# Patient Record
Sex: Male | Born: 1965 | Race: White | Hispanic: No | Marital: Married | Smoking: Never smoker
Health system: Southern US, Community
[De-identification: ages and names within clinical notes are randomized; demographics above are authoritative.]

## PROBLEM LIST (undated history)

## (undated) DIAGNOSIS — I720 Aneurysm of carotid artery: Secondary | ICD-10-CM

## (undated) DIAGNOSIS — I10 Essential (primary) hypertension: Secondary | ICD-10-CM

## (undated) HISTORY — PX: HERNIA REPAIR: SHX51

## (undated) HISTORY — DX: Essential (primary) hypertension: I10

## (undated) HISTORY — DX: Aneurysm of carotid artery: I72.0

## (undated) HISTORY — PX: TONSILLECTOMY: SUR1361

---

## 2011-03-21 ENCOUNTER — Ambulatory Visit (HOSPITAL_BASED_OUTPATIENT_CLINIC_OR_DEPARTMENT_OTHER): Payer: BC Managed Care – PPO | Attending: Internal Medicine

## 2011-03-21 DIAGNOSIS — G4733 Obstructive sleep apnea (adult) (pediatric): Secondary | ICD-10-CM | POA: Insufficient documentation

## 2011-03-24 DIAGNOSIS — G4733 Obstructive sleep apnea (adult) (pediatric): Secondary | ICD-10-CM

## 2011-03-25 NOTE — Procedures (Signed)
Curtis Martinez, Curtis Martinez                ACCOUNT NO.:  [id number]  MEDICAL RECORD NO.:  [id number]          PATIENT TYPE:  OUT  LOCATION:  SLEEP CENTER                 FACILITY:  [facility]  PHYSICIAN:  Albirda Shiel D. Maple Hudson, MD, FCCP, FACPDATE OF BIRTH:  [birth year] 28, 1965  DATE OF STUDY:  03/21/2011                           NOCTURNAL POLYSOMNOGRAM  REFERRING PHYSICIAN:  Dr. Artis Delay  REFERRING PHYSICIAN:  Dr. Artis Delay.  INDICATION FOR STUDY:  Hypersomnia with sleep apnea.  EPWORTH SLEEPINESS SCORE:  3/24.  BMI 31, weight 253 pounds, height 73 inches, neck 16.5 inches.  MEDICATIONS:  Home medications are charted and reviewed.  SLEEP ARCHITECTURE:  Split study protocol.  During the diagnostic phase, total sleep time 172 minutes with sleep efficiency 88%.  Stage I was 3.5%, stage II 88.1%, stage III absent, REM 8.4% of total sleep time. Sleep latency 5.5 minutes, REM latency 157 minutes, awake after sleep onset 18 minutes, arousal index 20.9.  BEDTIME MEDICATION:  None.  RESPIRATORY DATA:  Split study protocol.  Apnea-hypopnea index (AHI) 15.7 per hour.  A total of 45 events was scored including 14 obstructive apneas and 31 hypopneas.  Events were associated with supine sleep position.  REM AHI 62.1 per hour.  CPAP was titrated to 13 CWP, AHI 0 per hour.  He wore a medium ResMed Quattro FX full-face mask with heated humidifier.  OXYGEN DATA:  Before CPAP, snoring was loud with oxygen desaturation to a nadir of 74% on room air.  With CPAP titration, mean oxygen saturation held 95.3% on room air and snoring was prevented.  CARDIAC DATA:  Normal sinus rhythm.  MOVEMENT-PARASOMNIA:  No significant movement disturbance.  Bathroom x2.  IMPRESSIONS-RECOMMENDATIONS: 1. Mild-to-moderate obstructive sleep apnea/hypopnea syndrome, AHI     15.7 per hour with supine events, loud snoring and oxygen     desaturation to a nadir of 74% on room air. 2. Successful continuous positive airway pressure  titration to 13 CWP,     AHI 0 per hour.  He wore a medium     ResMed Quattro FX full-face mask with heated humidifier.  Snoring     was prevented and oxygenation improved.     Sharniece Gibbon D. Maple Hudson, MD, [facility], FACP Diplomate, Biomedical engineer of Sleep Medicine Electronically Signed    CDY/MEDQ  D:  03/24/2011 10:44:59  T:  03/25/2011 00:37:52  Job:  [id number]

## 2014-07-21 ENCOUNTER — Other Ambulatory Visit: Payer: Self-pay | Admitting: Family Medicine

## 2014-07-21 DIAGNOSIS — R9389 Abnormal findings on diagnostic imaging of other specified body structures: Secondary | ICD-10-CM

## 2014-07-30 ENCOUNTER — Ambulatory Visit
Admission: RE | Admit: 2014-07-30 | Discharge: 2014-07-30 | Disposition: A | Discharge status: Home / Self Care | Payer: BLUE CROSS/BLUE SHIELD | Source: Ambulatory Visit | Attending: Family Medicine | Admitting: Family Medicine

## 2014-07-30 DIAGNOSIS — R9389 Abnormal findings on diagnostic imaging of other specified body structures: Secondary | ICD-10-CM

## 2015-09-01 IMAGING — US US CAROTID DUPLEX BILAT
1 series · 13 of 24 positions shown · non-contrast
Comparison: None.

CLINICAL DATA: Abnormal carotid ultrasound on [facility] screening
examination approximately 6 months ago. History of hypertension.

EXAM:
BILATERAL CAROTID DUPLEX ULTRASOUND
TECHNIQUE: Gray scale imaging, color Doppler and duplex ultrasound were
performed of bilateral carotid and vertebral arteries in the neck.

[Series 1: us carotid duplex bilat · 0.08mm/px · 13 of 65 slices shown]
[im 1/65]
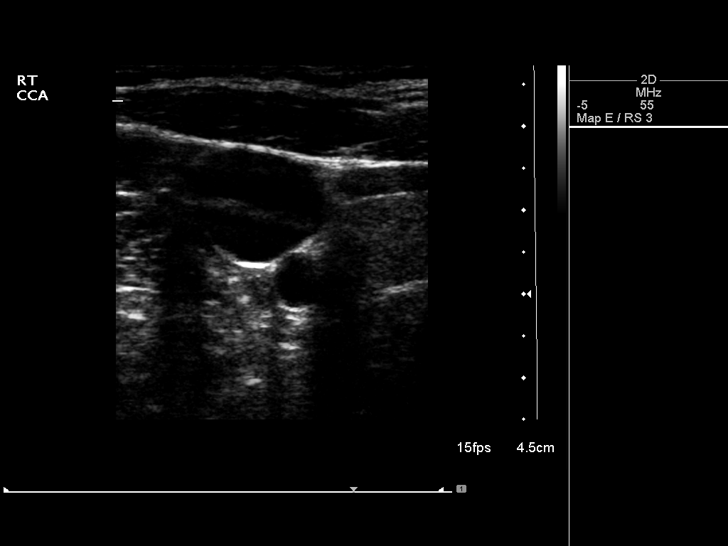
[im 6/65]
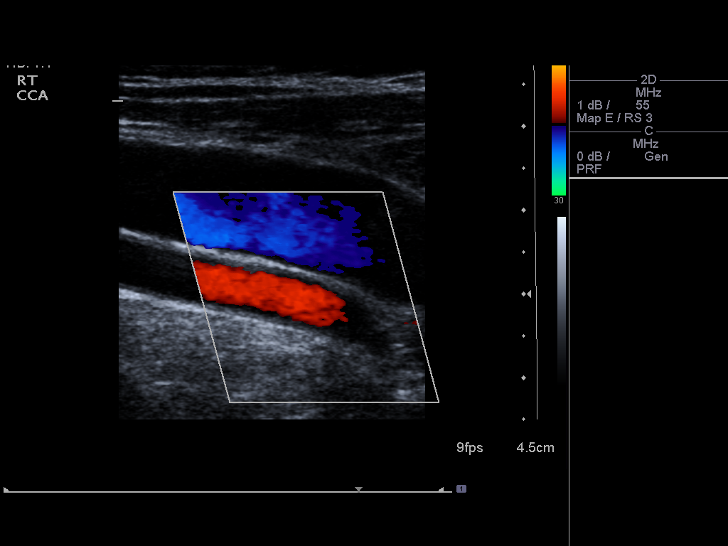
[im 12/65]
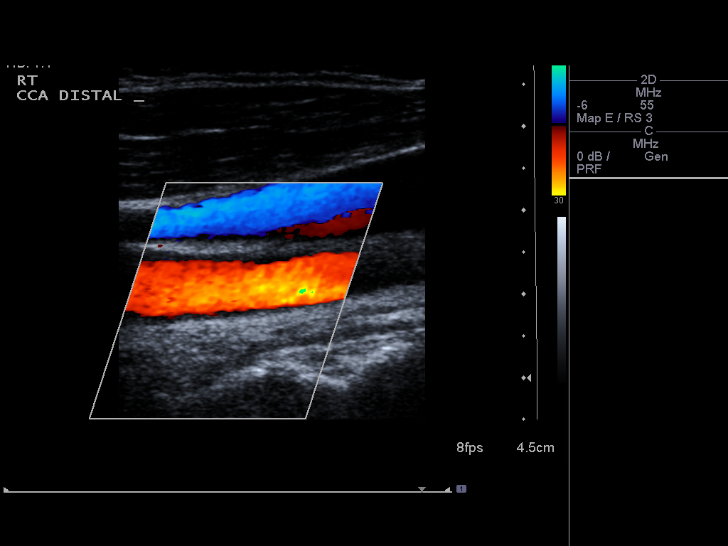
[im 17/65]
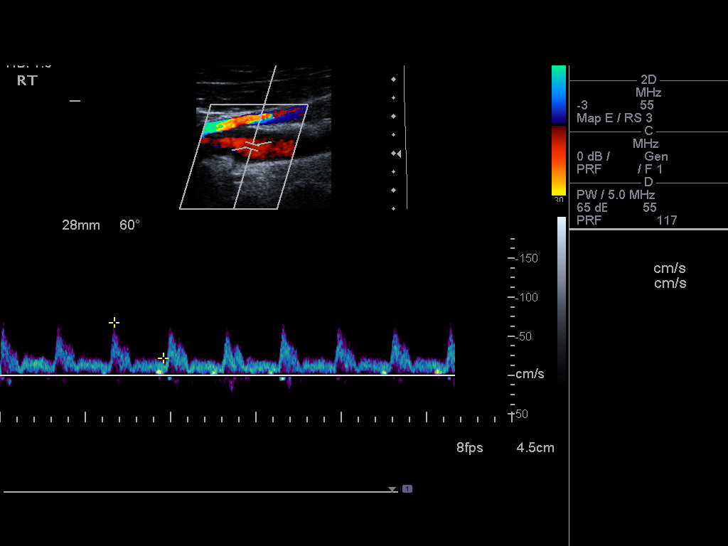
[im 23/65]
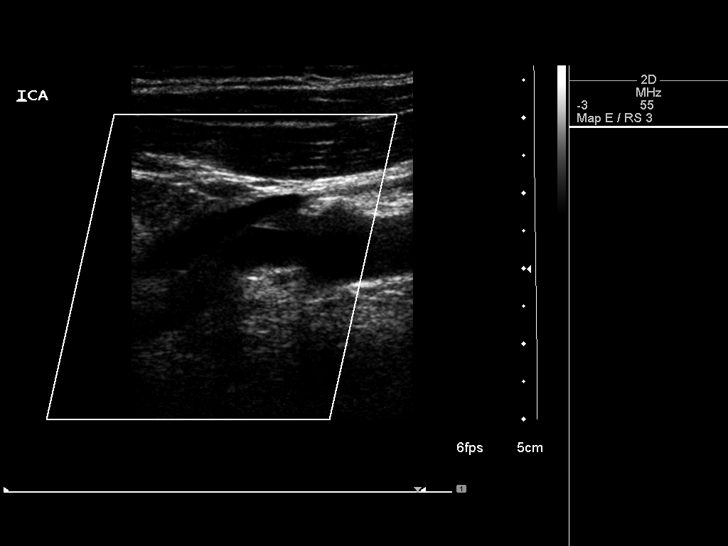
[im 28/65]
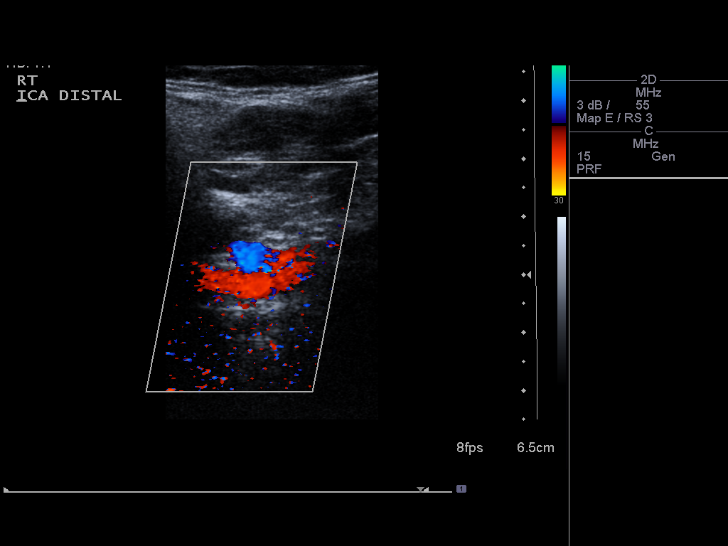
[im 34/65]
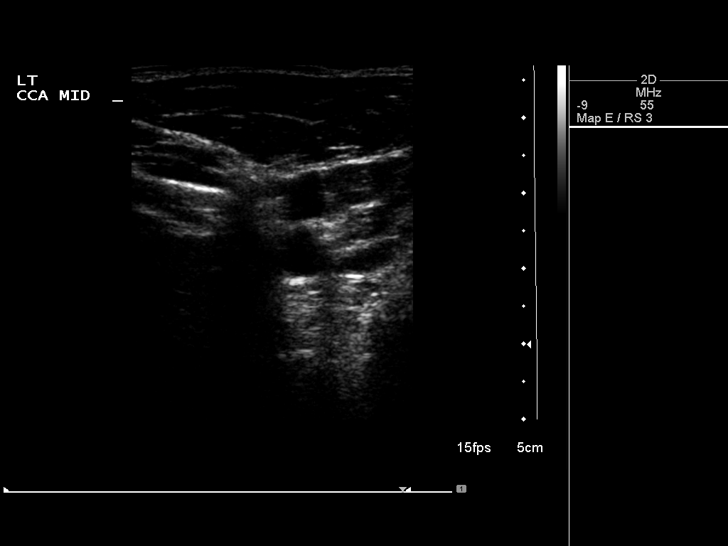
[im 37/65]
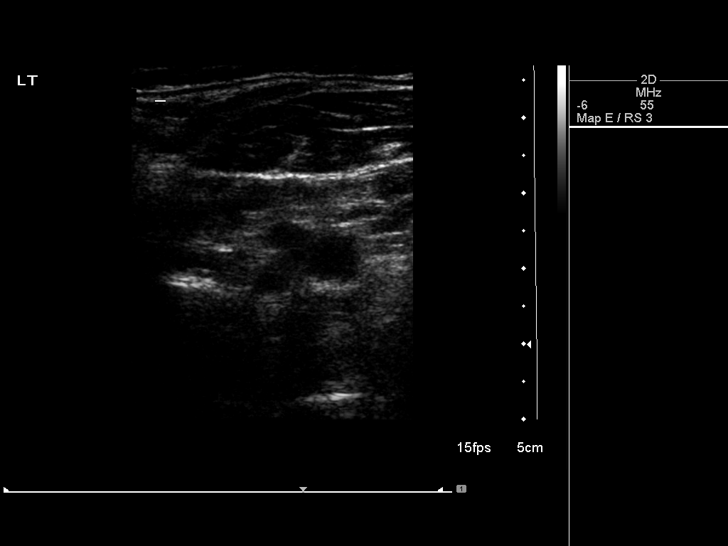
[im 42/65]
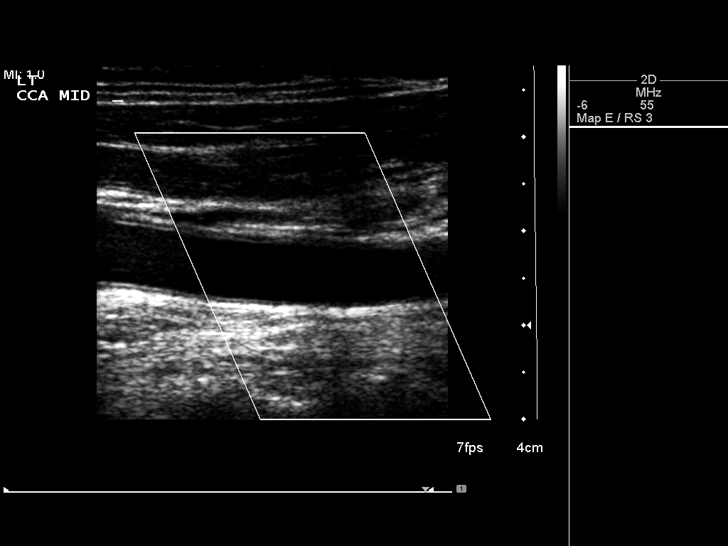
[im 48/65]
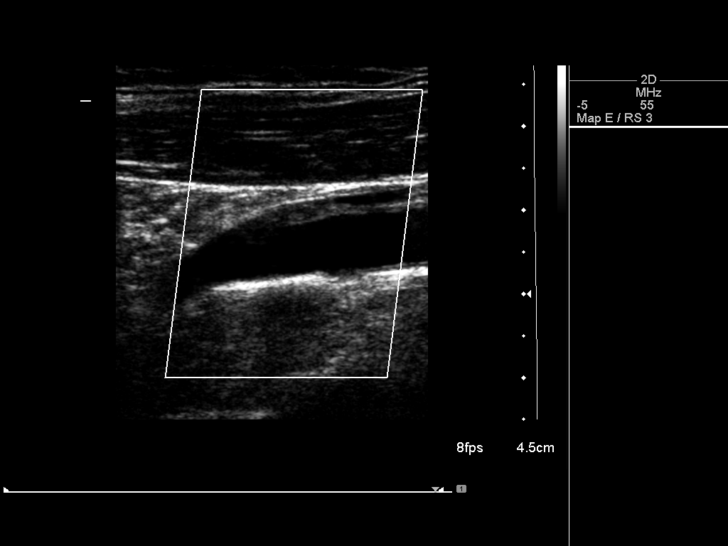
[im 53/65]
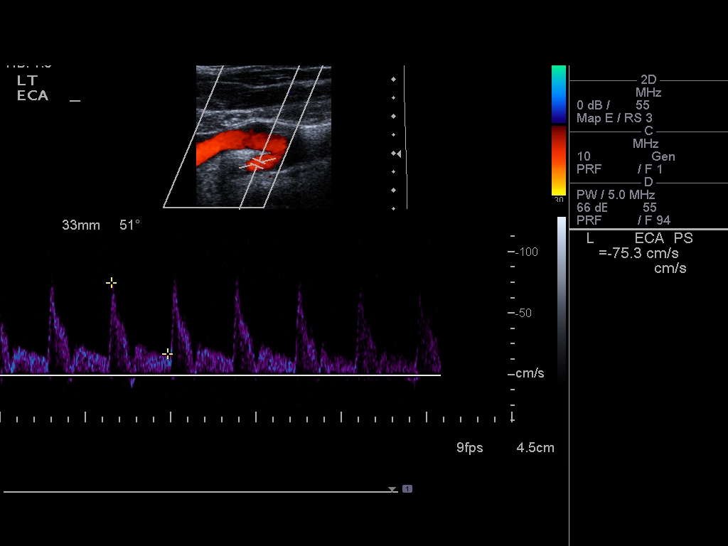
[im 59/65]
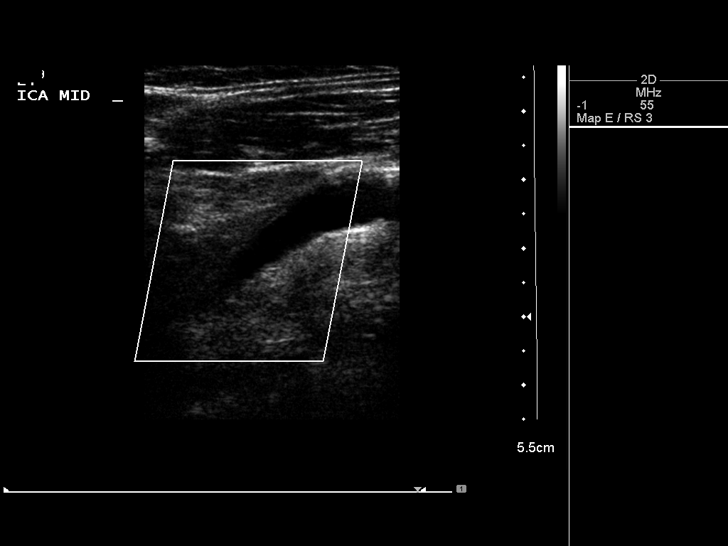
[im 65/65]
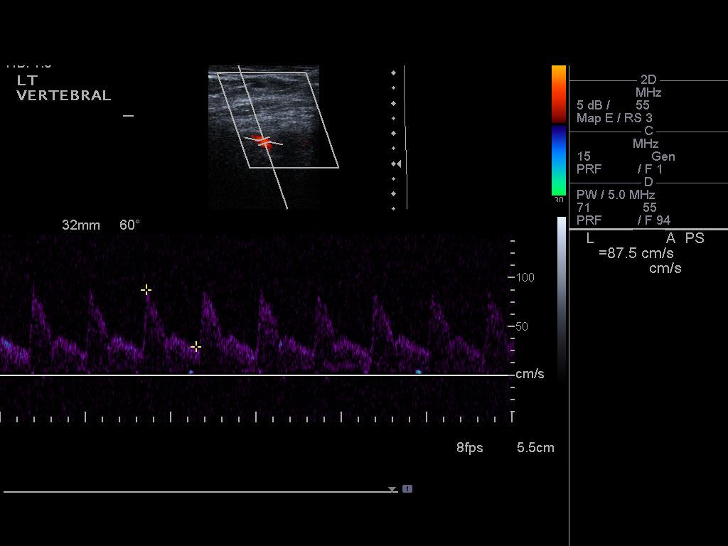

[13 of 24 positions shown; findings below may reference images not displayed]

FINDINGS: Criteria: Quantification of carotid stenosis is based on velocity
parameters that correlate the residual internal carotid diameter
with NASCET-based stenosis levels, using the diameter of the distal
internal carotid lumen as the denominator for stenosis measurement.

The following velocity measurements were obtained:

RIGHT

ICA:  67/17 cm/sec

CCA:  125/21 cm/sec

SYSTOLIC ICA/CCA RATIO:

DIASTOLIC ICA/CCA RATIO:

ECA:  99 cm/sec

LEFT

ICA:  71/30 cm/sec

CCA:  132/27 cm/sec

SYSTOLIC ICA/CCA RATIO:

DIASTOLIC ICA/CCA RATIO:

ECA:  90 cm/sec

RIGHT CAROTID ARTERY: There is a minimal amount of hypoechoic plaque
involving the origin and proximal aspects of the right internal
carotid artery (image 24), not resulting in elevated peak systolic
velocities within the interrogated course of the right internal
carotid artery to suggest a hemodynamically significant stenosis.

RIGHT VERTEBRAL ARTERY:  Anechoic the

LEFT CAROTID ARTERY: There is a minimal amount of hypoechoic plaque
within the left carotid bulb (image 50), extending to involve the
origin and proximal aspects of the left internal carotid artery
(image 57), not resulting in elevated peak systolic velocities
within the interrogated course of the left internal carotid artery
to suggest a hemodynamically significant stenosis.

LEFT VERTEBRAL ARTERY:  Antegrade flow
IMPRESSION: Minimal amount of bilateral atherosclerotic plaque, left
subjectively greater than right, not resulting in a hemodynamically
significant stenosis.

## 2017-11-22 DIAGNOSIS — Z23 Encounter for immunization: Secondary | ICD-10-CM | POA: Diagnosis not present

## 2017-11-22 DIAGNOSIS — E782 Mixed hyperlipidemia: Secondary | ICD-10-CM | POA: Diagnosis not present

## 2017-11-22 DIAGNOSIS — I10 Essential (primary) hypertension: Secondary | ICD-10-CM | POA: Diagnosis not present

## 2017-11-22 DIAGNOSIS — M2550 Pain in unspecified joint: Secondary | ICD-10-CM | POA: Diagnosis not present

## 2017-11-22 DIAGNOSIS — R7301 Impaired fasting glucose: Secondary | ICD-10-CM | POA: Diagnosis not present

## 2017-12-14 DIAGNOSIS — Z719 Counseling, unspecified: Secondary | ICD-10-CM | POA: Diagnosis not present

## 2018-01-01 DIAGNOSIS — Z719 Counseling, unspecified: Secondary | ICD-10-CM | POA: Diagnosis not present

## 2018-01-08 DIAGNOSIS — Z719 Counseling, unspecified: Secondary | ICD-10-CM | POA: Diagnosis not present

## 2018-01-15 DIAGNOSIS — Z719 Counseling, unspecified: Secondary | ICD-10-CM | POA: Diagnosis not present

## 2018-01-22 DIAGNOSIS — Z719 Counseling, unspecified: Secondary | ICD-10-CM | POA: Diagnosis not present

## 2021-02-24 ENCOUNTER — Other Ambulatory Visit: Payer: Self-pay | Admitting: Family Medicine

## 2021-02-24 DIAGNOSIS — I779 Disorder of arteries and arterioles, unspecified: Secondary | ICD-10-CM

## 2021-03-09 ENCOUNTER — Other Ambulatory Visit: Payer: BLUE CROSS/BLUE SHIELD

## 2021-03-15 ENCOUNTER — Ambulatory Visit
Admission: RE | Admit: 2021-03-15 | Discharge: 2021-03-15 | Disposition: A | Discharge status: Home / Self Care | Payer: BLUE CROSS/BLUE SHIELD | Source: Ambulatory Visit | Attending: Family Medicine | Admitting: Family Medicine

## 2021-03-15 DIAGNOSIS — I779 Disorder of arteries and arterioles, unspecified: Secondary | ICD-10-CM

## 2021-04-25 ENCOUNTER — Other Ambulatory Visit: Payer: Self-pay

## 2021-04-25 ENCOUNTER — Encounter: Payer: Self-pay | Admitting: Vascular Surgery

## 2021-04-25 ENCOUNTER — Ambulatory Visit (INDEPENDENT_AMBULATORY_CARE_PROVIDER_SITE_OTHER): Payer: Managed Care, Other (non HMO) | Admitting: Vascular Surgery

## 2021-04-25 DIAGNOSIS — I6521 Occlusion and stenosis of right carotid artery: Secondary | ICD-10-CM

## 2021-04-25 DIAGNOSIS — I779 Disorder of arteries and arterioles, unspecified: Secondary | ICD-10-CM | POA: Insufficient documentation

## 2021-04-25 NOTE — Progress Notes (Signed)
Patient name: Curtis Martinez MRN: [id number] DOB: 1965/12/17 Sex: male  REASON FOR CONSULT: Evaluate carotid artery stenosis  HPI: Curtis Martinez is a 55 y.o. male, with history of hypertension, prediabetes, obstructive sleep apnea the presents for evaluation of carotid artery stenosis.  Patient had a screening study many years ago that identified evidence of carotid artery disease.  This has been followed by his PCP.  He recently had a repeat study on 03/15/2021 that showed a moderate amount of right-sided atherosclerotic plaque with elevated peak systolic velocities compatible with a 50 to 69% stenosis.  He denies any history of strokes or TIAs.  He has no history of neck surgery or neck radiation.  He does not smoke.  He does take statin daily.  He is not on aspirin.  Past medical history below states a right carotid aneurysm but he has no knowledge of this.  There are no documentation of aneurysms in the report.  Past Medical History:  Diagnosis Date   Carotid aneurysm, right (HCC)    Hypertension     Past Surgical History:  Procedure Laterality Date   HERNIA REPAIR     TONSILLECTOMY      Family History  Problem Relation Age of Onset   Alzheimer's disease Mother    Cancer Father     SOCIAL HISTORY: Social History   Socioeconomic History   Marital status: Married    Spouse name: Not on file   Number of children: Not on file   Years of education: Not on file   Highest education level: Not on file  Occupational History   Not on file  Tobacco Use   Smoking status: Never   Smokeless tobacco: Never  Substance and Sexual Activity   Alcohol use: Yes   Drug use: Never   Sexual activity: Not on file  Other Topics Concern   Not on file  Social History Narrative   Not on file   Social Determinants of Health   Financial Resource Strain: Not on file  Food Insecurity: Not on file  Transportation Needs: Not on file  Physical Activity: Not on file  Stress: Not on file   Social Connections: Not on file  Intimate Partner Violence: Not on file    No Known Allergies  No current outpatient medications on file.   No current facility-administered medications for this visit.    REVIEW OF SYSTEMS:  [X]  denotes positive finding, [ ]  denotes negative finding Cardiac  Comments:  Chest pain or chest pressure:    Shortness of breath upon exertion:    Short of breath when lying flat:    Irregular heart rhythm:        Vascular    Pain in calf, thigh, or hip brought on by ambulation:    Pain in feet at night that wakes you up from your sleep:     Blood clot in your veins:    Leg swelling:         Pulmonary    Oxygen at home:    Productive cough:     Wheezing:         Neurologic    Sudden weakness in arms or legs:     Sudden numbness in arms or legs:     Sudden onset of difficulty speaking or slurred speech:    Temporary loss of vision in one eye:     Problems with dizziness:         Gastrointestinal  Blood in stool:     Vomited blood:         Genitourinary    Burning when urinating:     Blood in urine:        Psychiatric    Major depression:         Hematologic    Bleeding problems:    Problems with blood clotting too easily:        Skin    Rashes or ulcers:        Constitutional    Fever or chills:      PHYSICAL EXAM: Vitals:   04/25/21 0820 04/25/21 0827  BP: (!) 143/79 132/78  Pulse: 93 93  Resp: 18   Temp: 97.8 F (36.6 C)   TempSrc: Temporal   SpO2: 97%   Weight: 265 lb 3.2 oz (120.3 kg)   Height: 6\' 1"  (1.854 m)     GENERAL: The patient is a well-nourished male, in no acute distress. The vital signs are documented above. CARDIAC: There is a regular rate and rhythm.  VASCULAR:  Palpable carotid pulses bilaterally No previous neck incision PULMONARY: No respiratory distress. ABDOMEN: Soft and non-tender . MUSCULOSKELETAL: There are no major deformities or cyanosis. NEUROLOGIC: No focal weakness or paresthesias  are detected.  Cranial nerves II through XII grossly intact SKIN: There are no ulcers or rashes noted. PSYCHIATRIC: The patient has a normal affect.  DATA:   Carotid ultrasound from [facility] 03/15/2021 shows moderate amount of right-sided atherosclerotic plaque progressed from 2016 with right internal carotid artery velocities compatible with 50 to 69% luminal narrowing.  Assessment/Plan:  55 year old male presents for evaluation of asymptomatic right 50 to 69% ICA stenosis.  I discussed that in the setting of asymptomatic carotid disease there is no indication for surgical intervention at this time.  We reserve surgical intervention for greater than 80% stenosis for asymptomatic carotid disease.  I have recommended medical management at this time and he is already taking statin and I have asked that he also start an 81 mg aspirin daily for cardiovascular risk reduction.  I will have him follow-up in 6 months with carotid ultrasound here in the office and then to see me.  Discussed he call with questions or concerns.   55, MD [facility] [place] Office: [phone]

## 2021-04-26 ENCOUNTER — Other Ambulatory Visit: Payer: Self-pay | Admitting: *Deleted

## 2021-04-26 DIAGNOSIS — I6521 Occlusion and stenosis of right carotid artery: Secondary | ICD-10-CM

## 2021-11-07 ENCOUNTER — Ambulatory Visit (INDEPENDENT_AMBULATORY_CARE_PROVIDER_SITE_OTHER): Payer: Managed Care, Other (non HMO) | Admitting: Vascular Surgery

## 2021-11-07 ENCOUNTER — Encounter: Payer: Self-pay | Admitting: Vascular Surgery

## 2021-11-07 ENCOUNTER — Ambulatory Visit (HOSPITAL_COMMUNITY)
Admission: RE | Admit: 2021-11-07 | Discharge: 2021-11-07 | Disposition: A | Discharge status: Home / Self Care | Payer: Managed Care, Other (non HMO) | Source: Ambulatory Visit | Attending: Vascular Surgery | Admitting: Vascular Surgery

## 2021-11-07 VITALS — BP 129/80 | HR 85 | Temp 97.6°F | Resp 18 | Ht 73.0 in | Wt 272.0 lb

## 2021-11-07 DIAGNOSIS — I6521 Occlusion and stenosis of right carotid artery: Secondary | ICD-10-CM

## 2021-11-07 DIAGNOSIS — I6523 Occlusion and stenosis of bilateral carotid arteries: Secondary | ICD-10-CM

## 2021-11-07 NOTE — Progress Notes (Signed)
Patient name: Curtis Martinez MRN: [id number] DOB: 1965-09-23 Sex: male  REASON FOR CONSULT: 6 month follow-up for carotid artery surveillance  HPI: Curtis Martinez is a 56 y.o. male, with history of hypertension, prediabetes, obstructive sleep apnea the presents for 6 month follow-up of his carotid artery disease.  Patient had a screening study many years ago that identified evidence of carotid artery disease.  This has been followed by his PCP.  He had a repeat study on 03/15/2021 that showed a moderate amount of right-sided atherosclerotic plaque with elevated peak systolic velocities compatible with a 50 to 69% stenosis.  We recommended interval 70-month follow-up and he presents today.  He denies any history of stroke or TIA.  He had no vision loss or weakness in the last 6 months.  Has gained some weight over the winter.    Past Medical History:  Diagnosis Date   Carotid aneurysm, right (HCC)    Hypertension     Past Surgical History:  Procedure Laterality Date   HERNIA REPAIR     TONSILLECTOMY      Family History  Problem Relation Age of Onset   Alzheimer's disease Mother    Cancer Father     SOCIAL HISTORY: Social History   Socioeconomic History   Marital status: Married    Spouse name: Not on file   Number of children: Not on file   Years of education: Not on file   Highest education level: Not on file  Occupational History   Not on file  Tobacco Use   Smoking status: Never   Smokeless tobacco: Never  Substance and Sexual Activity   Alcohol use: Yes   Drug use: Never   Sexual activity: Not on file  Other Topics Concern   Not on file  Social History Narrative   Not on file   Social Determinants of Health   Financial Resource Strain: Not on file  Food Insecurity: Not on file  Transportation Needs: Not on file  Physical Activity: Not on file  Stress: Not on file  Social Connections: Not on file  Intimate Partner Violence: Not on file    No Known  Allergies  Current Outpatient Medications  Medication Sig Dispense Refill   aspirin EC 81 MG tablet Take 81 mg by mouth daily. Swallow whole.     betamethasone dipropionate 0.05 % cream 1 application a thin film to affected area     hydrochlorothiazide (MICROZIDE) 12.5 MG capsule 1 capsule in the morning     lisinopril (ZESTRIL) 40 MG tablet 1 tablet     rosuvastatin (CRESTOR) 5 MG tablet 1 tablet     sildenafil (REVATIO) 20 MG tablet      No current facility-administered medications for this visit.    REVIEW OF SYSTEMS:  [X]  denotes positive finding, [ ]  denotes negative finding Cardiac  Comments:  Chest pain or chest pressure:    Shortness of breath upon exertion:    Short of breath when lying flat:    Irregular heart rhythm:        Vascular    Pain in calf, thigh, or hip brought on by ambulation:    Pain in feet at night that wakes you up from your sleep:     Blood clot in your veins:    Leg swelling:         Pulmonary    Oxygen at home:    Productive cough:     Wheezing:  Neurologic    Sudden weakness in arms or legs:     Sudden numbness in arms or legs:     Sudden onset of difficulty speaking or slurred speech:    Temporary loss of vision in one eye:     Problems with dizziness:         Gastrointestinal    Blood in stool:     Vomited blood:         Genitourinary    Burning when urinating:     Blood in urine:        Psychiatric    Major depression:         Hematologic    Bleeding problems:    Problems with blood clotting too easily:        Skin    Rashes or ulcers:        Constitutional    Fever or chills:      PHYSICAL EXAM: Vitals:   11/07/21 1440 11/07/21 1442  BP: 139/81 129/80  Pulse: 85 85  Resp: 18   Temp: 97.6 F (36.4 C)   TempSrc: Temporal   SpO2: 97%   Weight: 272 lb (123.4 kg)   Height: 6\' 1"  (1.854 m)     GENERAL: The patient is a well-nourished male, in no acute distress. The vital signs are documented  above. CARDIAC: There is a regular rate and rhythm.  VASCULAR:  No previous neck incision PULMONARY: No respiratory distress. ABDOMEN: Soft and non-tender . MUSCULOSKELETAL: There are no major deformities or cyanosis. NEUROLOGIC: No focal weakness or paresthesias are detected.  Cranial nerves II through XII grossly intact SKIN: There are no ulcers or rashes noted. PSYCHIATRIC: The patient has a normal affect.  DATA:   Carotid duplex today shows 40 to 59% right ICA stenosis by velocity criteria with a velocity 161/46 and minimal 1-39% stenosis on the left  Carotid ultrasound from [facility] imaging 03/15/2021 shows moderate amount of right-sided atherosclerotic plaque with right internal carotid artery velocities compatible with 50 to 69% luminal narrowing.  Assessment/Plan:  56 year old male presents for interval 15-month follow-up of a moderate 50 to 69% right ICA stenosis.  He carotid disease remains asymptomatic.  Based on carotid duplex today he has decreased velocities on the right suggesting a 40 to 59% stenosis.  No indication for surgical intervention.  Discussed we reserve surgical intervention for greater than 80% stenosis for asymptomatic carotid disease.  I have recommended continued medical management with aspirin and statin.  I will have him follow-up in 1 year with carotid ultrasound here in the office.   8-month, MD [facility] [place] Office: [phone]

## 2022-04-17 IMAGING — US US CAROTID DUPLEX BILAT
1 series · 13 of 24 positions shown · non-contrast
Comparison: 07/30/2014

CLINICAL DATA: Carotid artery disease. History of hypertension and
hyperlipidemia.

EXAM:
BILATERAL CAROTID DUPLEX ULTRASOUND
TECHNIQUE: Gray scale imaging, color Doppler and duplex ultrasound were
performed of bilateral carotid and vertebral arteries in the neck.

[Series 1: us carotid duplex bilat · 0.06mm/px · 13 of 75 slices shown]
[im 1/75]
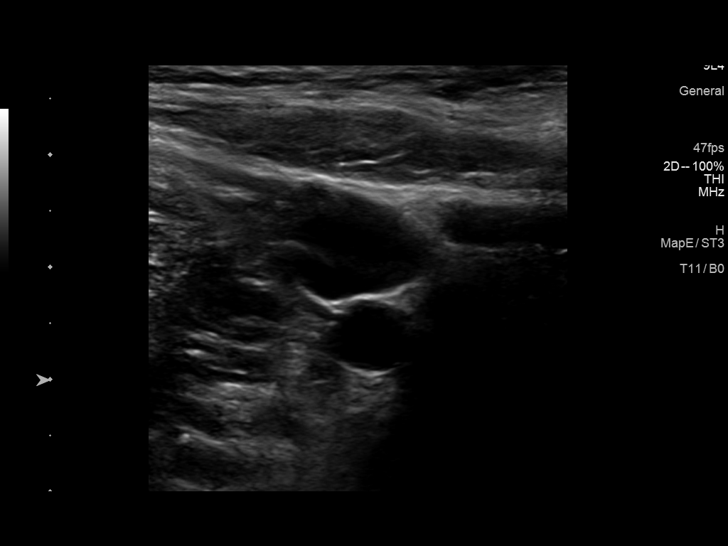
[im 7/75]
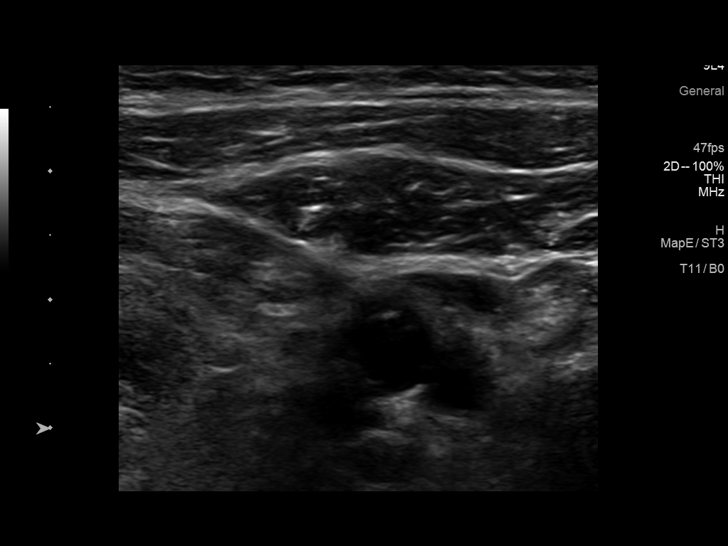
[im 13/75]
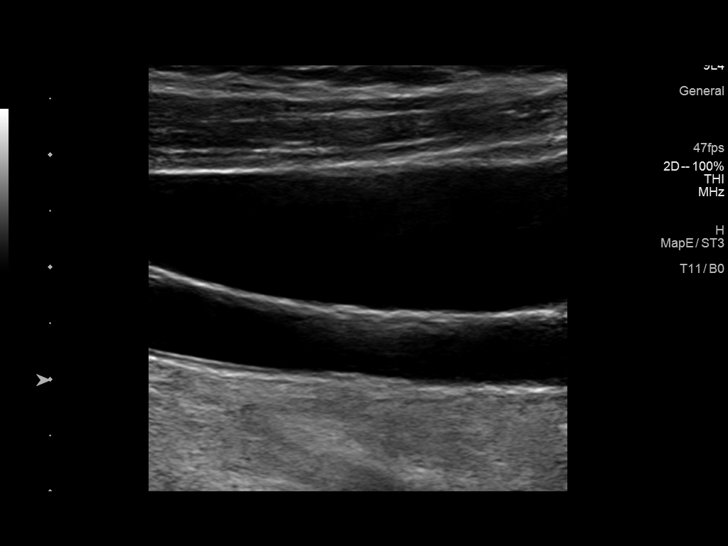
[im 20/75]
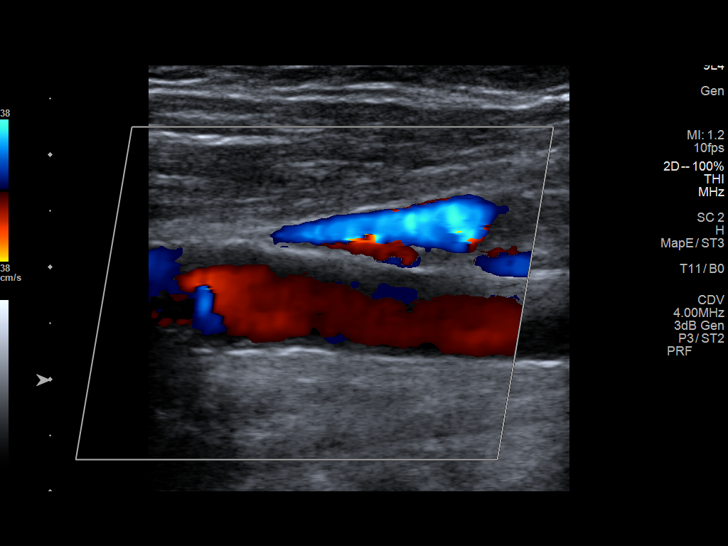
[im 26/75]
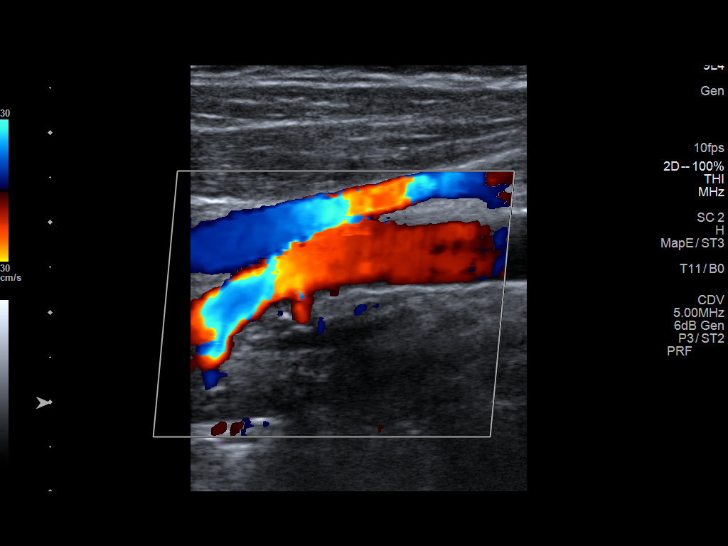
[im 33/75]
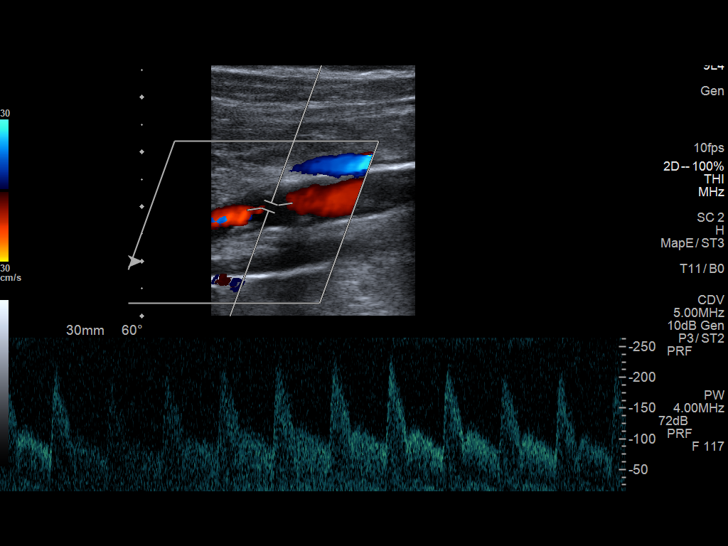
[im 39/75]
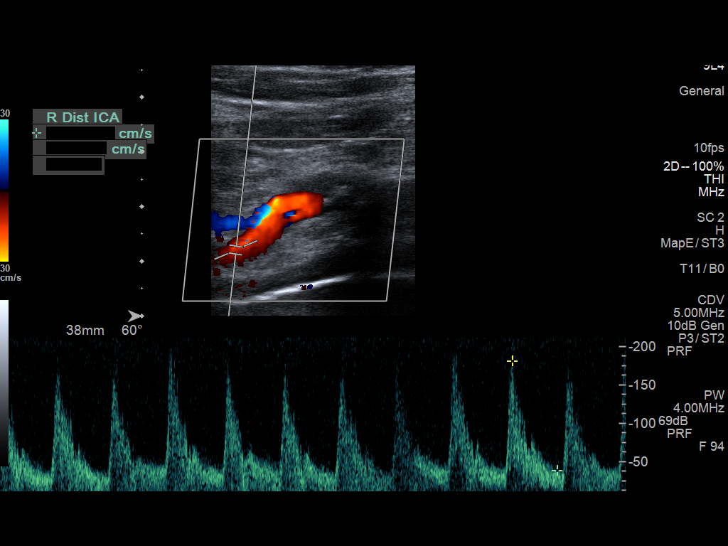
[im 42/75]
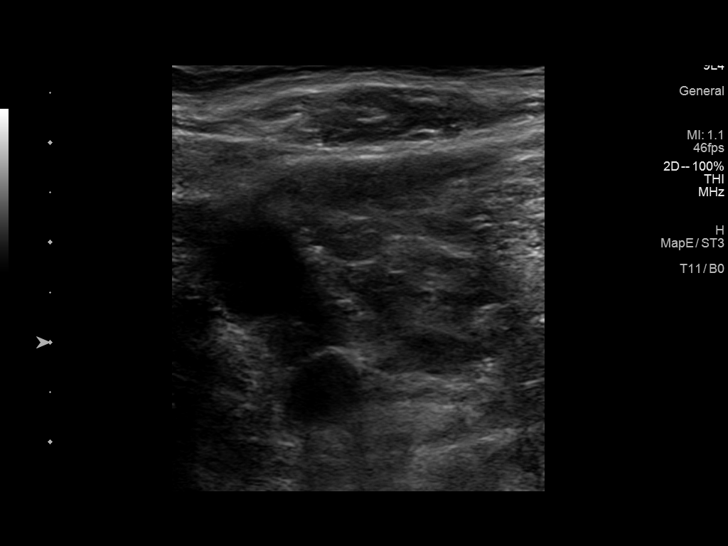
[im 49/75]
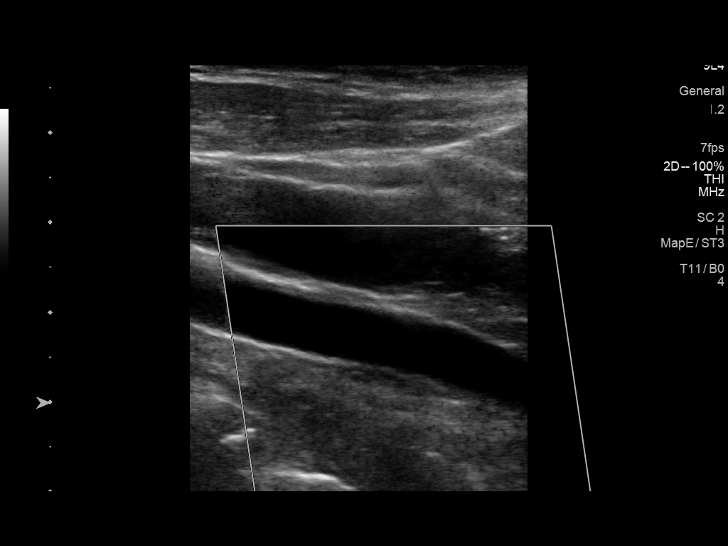
[im 55/75]
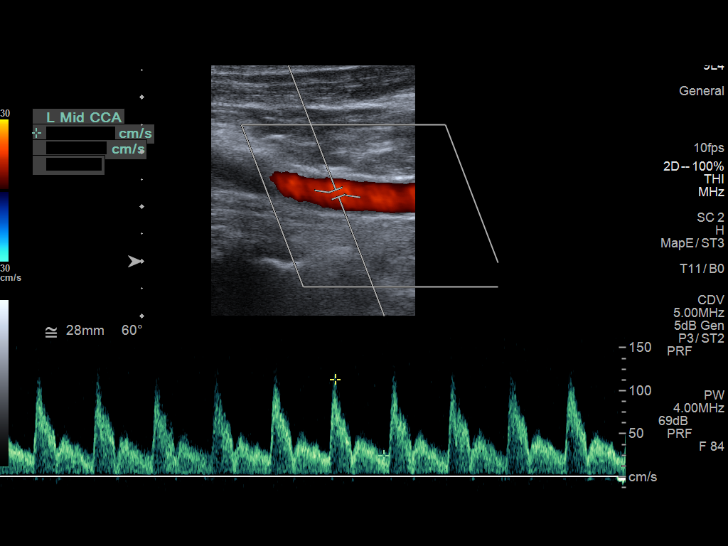
[im 62/75]
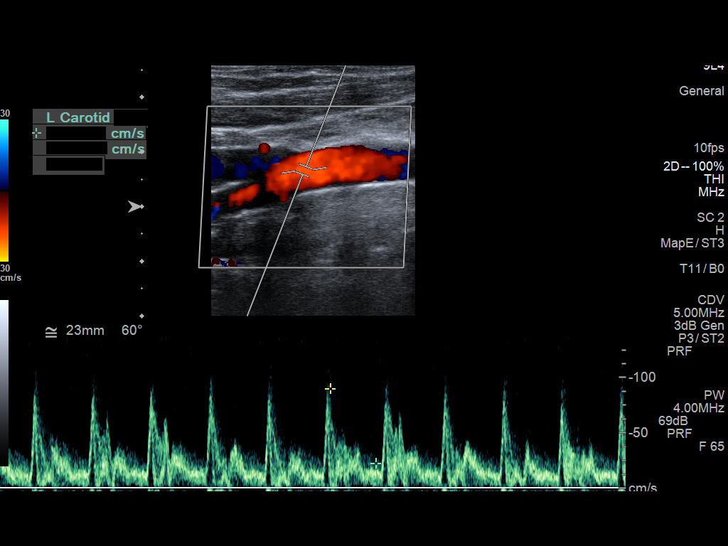
[im 68/75]
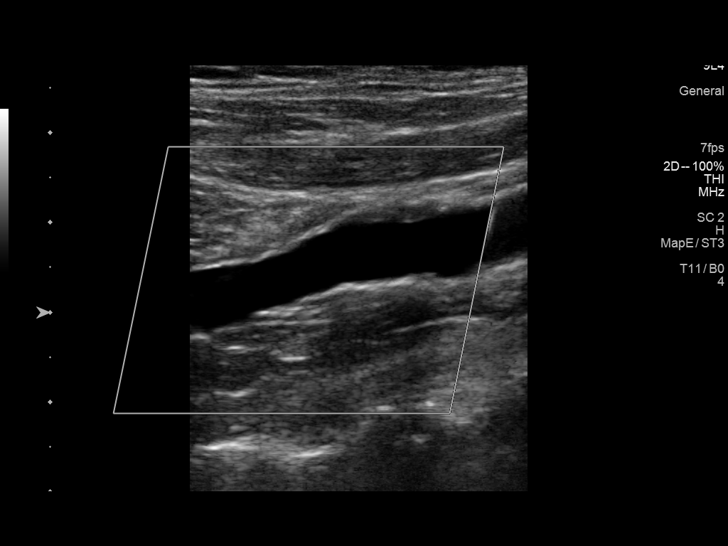
[im 75/75]
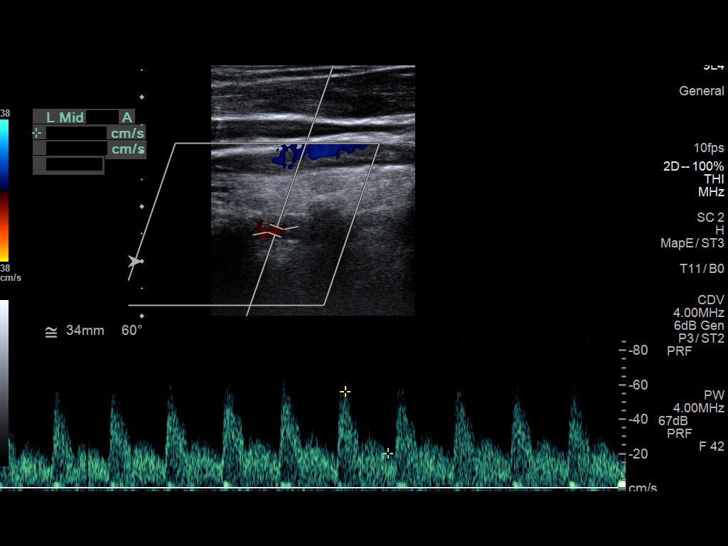

[13 of 24 positions shown; findings below may reference images not displayed]

FINDINGS: Criteria: Quantification of carotid stenosis is based on velocity
parameters that correlate the residual internal carotid diameter
with NASCET-based stenosis levels, using the diameter of the distal
internal carotid lumen as the denominator for stenosis measurement.

The following velocity measurements were obtained:

RIGHT

ICA: 223/94 cm/sec

CCA: 149/29 cm/sec

SYSTOLIC ICA/CCA RATIO:

ECA: 132 cm/sec

LEFT

ICA: 107/41 cm/sec

CCA: 167/34 cm/sec

SYSTOLIC ICA/CCA RATIO:

ECA: 122 cm/sec

RIGHT CAROTID ARTERY: There is a moderate amount of eccentric
hypoechoic plaque involving the origin and proximal aspects of the
right internal carotid artery (images 32 and 33), progressed
compared to the 0377 examination and now results in elevated peak
systolic velocities throughout the interrogated course of the right
internal carotid artery. Greatest acquired peak systolic velocity
within the proximal right ICA measures 223 centimeters/second (image
35).

RIGHT VERTEBRAL ARTERY:  Antegrade flow

LEFT CAROTID ARTERY: There is no grayscale evidence of significant
intimal thickening or atherosclerotic plaque affecting the
interrogated portions of the left carotid system. There are no
elevated peak systolic velocities within the interrogated course of
the left internal carotid artery to suggest a hemodynamically
significant stenosis.

LEFT VERTEBRAL ARTERY:  Antegrade flow

Upper extremity blood pressures: RIGHT: 128/71 LEFT: 127/66
IMPRESSION: 1. Moderate amount of right-sided atherosclerotic plaque, progressed
compared to the 0377 examination, now results in elevated peak
systolic velocities with the right internal carotid artery
compatible with the higher end of the 50-69% luminal narrowing
range. Further evaluation with CTA could be performed as clinically
indicated.
2. Unremarkable sonographic evaluation of the left carotid system.

## 2022-10-29 ENCOUNTER — Other Ambulatory Visit: Payer: Self-pay | Admitting: *Deleted

## 2022-10-29 DIAGNOSIS — I6523 Occlusion and stenosis of bilateral carotid arteries: Secondary | ICD-10-CM

## 2022-11-06 ENCOUNTER — Ambulatory Visit (INDEPENDENT_AMBULATORY_CARE_PROVIDER_SITE_OTHER): Payer: Managed Care, Other (non HMO) | Admitting: Vascular Surgery

## 2022-11-06 ENCOUNTER — Ambulatory Visit (HOSPITAL_COMMUNITY)
Admission: RE | Admit: 2022-11-06 | Discharge: 2022-11-06 | Disposition: A | Discharge status: Home / Self Care | Payer: Managed Care, Other (non HMO) | Source: Ambulatory Visit | Attending: Vascular Surgery | Admitting: Vascular Surgery

## 2022-11-06 ENCOUNTER — Encounter: Payer: Self-pay | Admitting: Vascular Surgery

## 2022-11-06 VITALS — BP 133/80 | HR 78 | Temp 97.8°F | Resp 18 | Ht 73.0 in | Wt 277.0 lb

## 2022-11-06 DIAGNOSIS — I6523 Occlusion and stenosis of bilateral carotid arteries: Secondary | ICD-10-CM

## 2022-11-06 NOTE — Progress Notes (Signed)
Patient name: Curtis Martinez MRN: [id number] DOB: [birth year] Sex: male  REASON FOR CONSULT: 1 year follow-up for carotid artery surveillance  HPI: Curtis Martinez is a 57 y.o. male, with history of hypertension, prediabetes, obstructive sleep apnea the presents for 1 year follow-up of his carotid artery disease.  Patient had a screening study many years ago that identified evidence of carotid artery disease.  This has been followed by his PCP.  He had a repeat study on 03/15/2021 that showed a moderate amount of right-sided atherosclerotic plaque with elevated peak systolic velocities compatible with a 50 to 69% stenosis.    On his last evaluation he had a 40 to 59% right ICA stenosis and a 1 to 39% left ICA stenosis by velocity criteria.  Reports no new neurologic events since last evaluation.  He has been diagnosed with asthma.  He is taking his aspirin and statin.    Past Medical History:  Diagnosis Date   Carotid aneurysm, right (HCC)    Hypertension     Past Surgical History:  Procedure Laterality Date   HERNIA REPAIR     TONSILLECTOMY      Family History  Problem Relation Age of Onset   Alzheimer's disease Mother    Cancer Father     SOCIAL HISTORY: Social History   Socioeconomic History   Marital status: Married    Spouse name: Not on file   Number of children: Not on file   Years of education: Not on file   Highest education level: Not on file  Occupational History   Not on file  Tobacco Use   Smoking status: Never   Smokeless tobacco: Never  Substance and Sexual Activity   Alcohol use: Yes   Drug use: Never   Sexual activity: Not on file  Other Topics Concern   Not on file  Social History Narrative   Not on file   Social Determinants of Health   Financial Resource Strain: Not on file  Food Insecurity: Not on file  Transportation Needs: Not on file  Physical Activity: Not on file  Stress: Not on file  Social Connections: Not on file  Intimate  Partner Violence: Not on file    No Known Allergies  Current Outpatient Medications  Medication Sig Dispense Refill   aspirin EC 81 MG tablet Take 81 mg by mouth daily. Swallow whole.     betamethasone dipropionate 0.05 % cream 1 application a thin film to affected area     hydrochlorothiazide (MICROZIDE) 12.5 MG capsule 1 capsule in the morning     lisinopril (ZESTRIL) 40 MG tablet 1 tablet     rosuvastatin (CRESTOR) 5 MG tablet 1 tablet     sildenafil (REVATIO) 20 MG tablet      No current facility-administered medications for this visit.    REVIEW OF SYSTEMS:  [X]  denotes positive finding, [ ]  denotes negative finding Cardiac  Comments:  Chest pain or chest pressure:    Shortness of breath upon exertion:    Short of breath when lying flat:    Irregular heart rhythm:        Vascular    Pain in calf, thigh, or hip brought on by ambulation:    Pain in feet at night that wakes you up from your sleep:     Blood clot in your veins:    Leg swelling:         Pulmonary    Oxygen at home:  Productive cough:     Wheezing:         Neurologic    Sudden weakness in arms or legs:     Sudden numbness in arms or legs:     Sudden onset of difficulty speaking or slurred speech:    Temporary loss of vision in one eye:     Problems with dizziness:         Gastrointestinal    Blood in stool:     Vomited blood:         Genitourinary    Burning when urinating:     Blood in urine:        Psychiatric    Major depression:         Hematologic    Bleeding problems:    Problems with blood clotting too easily:        Skin    Rashes or ulcers:        Constitutional    Fever or chills:      PHYSICAL EXAM: Vitals:   11/06/22 1517 11/06/22 1520  BP: 130/80 133/80  Pulse: 78 78  Resp: 18   Temp: 97.8 F (36.6 C)   TempSrc: Temporal   SpO2: 98%   Weight: 277 lb (125.6 kg)   Height: 6\' 1"  (1.854 m)     GENERAL: The patient is a well-nourished male, in no acute distress.  The vital signs are documented above. CARDIAC: There is a regular rate and rhythm.  PULMONARY: No respiratory distress. ABDOMEN: Soft and non-tender . MUSCULOSKELETAL: There are no major deformities or cyanosis. NEUROLOGIC: No focal weakness or paresthesias are detected.  Cranial nerves II through XII grossly intact SKIN: There are no ulcers or rashes noted. PSYCHIATRIC: The patient has a normal affect.  DATA:   Carotid duplex today 11/06/22 shows 40 to 59% right ICA stenosis by velocity criteria with a velocity 151/49 and minimal 1-39% stenosis on the left  Carotid ultrasound from [facility] imaging 03/15/2021 shows moderate amount of right-sided atherosclerotic plaque with right internal carotid artery velocities compatible with 50 to 69% luminal narrowing.  Assessment/Plan:  57 year old male presents for interval 1 year follow-up of a 40-59% right ICA stenosis.  He carotid disease remains asymptomatic.  Based on carotid duplex today he has a stable stenosis in the 40 to 59% range in the right ICA.  No indication for surgical intervention.  Discussed we reserve surgical intervention for greater than 80% stenosis for asymptomatic carotid disease.  I have recommended continued medical management with aspirin and statin.  I will have him follow-up in 1 year with carotid ultrasound here in the office for continued surveillance.   Cephus Shelling, MD [facility] [place] Office: [phone]

## 2022-11-17 ENCOUNTER — Other Ambulatory Visit: Payer: Self-pay

## 2022-11-17 DIAGNOSIS — I6523 Occlusion and stenosis of bilateral carotid arteries: Secondary | ICD-10-CM

## 2023-10-22 DIAGNOSIS — M180 Bilateral primary osteoarthritis of first carpometacarpal joints: Secondary | ICD-10-CM | POA: Diagnosis not present

## 2023-10-22 DIAGNOSIS — M1811 Unilateral primary osteoarthritis of first carpometacarpal joint, right hand: Secondary | ICD-10-CM | POA: Diagnosis not present

## 2023-11-11 ENCOUNTER — Other Ambulatory Visit: Payer: Self-pay

## 2023-11-11 DIAGNOSIS — I6523 Occlusion and stenosis of bilateral carotid arteries: Secondary | ICD-10-CM

## 2023-11-26 ENCOUNTER — Ambulatory Visit (INDEPENDENT_AMBULATORY_CARE_PROVIDER_SITE_OTHER): Payer: Self-pay | Admitting: Vascular Surgery

## 2023-11-26 ENCOUNTER — Encounter: Payer: Self-pay | Admitting: Vascular Surgery

## 2023-11-26 ENCOUNTER — Ambulatory Visit (HOSPITAL_COMMUNITY)
Admission: RE | Admit: 2023-11-26 | Discharge: 2023-11-26 | Disposition: A | Discharge status: Home / Self Care | Payer: Self-pay | Source: Ambulatory Visit | Attending: Vascular Surgery | Admitting: Vascular Surgery

## 2023-11-26 VITALS — BP 131/82 | HR 80 | Temp 97.6°F | Resp 18 | Ht 73.0 in | Wt 279.6 lb

## 2023-11-26 DIAGNOSIS — I6523 Occlusion and stenosis of bilateral carotid arteries: Secondary | ICD-10-CM | POA: Diagnosis not present

## 2023-11-26 NOTE — Progress Notes (Signed)
 Patient name: Curtis Martinez MRN: [id number] DOB: 1965-06-17 Sex: male  REASON FOR CONSULT: 1 year follow-up for carotid artery surveillance  HPI: Curtis Martinez is a 58 y.o. male, with history of hypertension, prediabetes, obstructive sleep apnea the presents for 1 year follow-up of his carotid artery disease.  Patient had a screening study many years ago that identified evidence of carotid artery disease.  This has been followed by his PCP.  He had a repeat study on 03/15/2021 that showed a moderate amount of right-sided atherosclerotic plaque with elevated peak systolic velocities compatible with a 50 to 69% stenosis.    On his last evaluation here he had a 40 to 59% right ICA stenosis and a 1 to 39% left ICA stenosis by velocity criteria.  Reports no new neurologic events since last evaluation.  Remains on aspirin statin.  No new concerns today.     Past Medical History:  Diagnosis Date   Carotid aneurysm, right (HCC)    Hypertension     Past Surgical History:  Procedure Laterality Date   HERNIA REPAIR     TONSILLECTOMY      Family History  Problem Relation Age of Onset   Alzheimer's disease Mother    Cancer Father     SOCIAL HISTORY: Social History   Socioeconomic History   Marital status: Married    Spouse name: Not on file   Number of children: Not on file   Years of education: Not on file   Highest education level: Not on file  Occupational History   Not on file  Tobacco Use   Smoking status: Never   Smokeless tobacco: Never  Vaping Use   Vaping status: Never Used  Substance and Sexual Activity   Alcohol use: Yes   Drug use: Never   Sexual activity: Not on file  Other Topics Concern   Not on file  Social History Narrative   Not on file   Social Drivers of Health   Financial Resource Strain: Not on file  Food Insecurity: Not on file  Transportation Needs: Not on file  Physical Activity: Not on file  Stress: Not on file  Social Connections: Not  on file  Intimate Partner Violence: Not on file    No Known Allergies  Current Outpatient Medications  Medication Sig Dispense Refill   aspirin EC 81 MG tablet Take 81 mg by mouth daily. Swallow whole.     betamethasone dipropionate 0.05 % cream 1 application a thin film to affected area     hydrochlorothiazide (MICROZIDE) 12.5 MG capsule 1 capsule in the morning     lisinopril (ZESTRIL) 40 MG tablet 1 tablet     naproxen sodium (ALEVE) 220 MG tablet Take 220 mg by mouth daily as needed. (Patient taking differently: Take 220 mg by mouth daily as needed. Takes 2 tablets by mouth)     rosuvastatin (CRESTOR) 5 MG tablet 1 tablet     sildenafil (REVATIO) 20 MG tablet      No current facility-administered medications for this visit.    REVIEW OF SYSTEMS:  [X]  denotes positive finding, [ ]  denotes negative finding Cardiac  Comments:  Chest pain or chest pressure:    Shortness of breath upon exertion:    Short of breath when lying flat:    Irregular heart rhythm:        Vascular    Pain in calf, thigh, or hip brought on by ambulation:    Pain in feet at  night that wakes you up from your sleep:     Blood clot in your veins:    Leg swelling:         Pulmonary    Oxygen at home:    Productive cough:     Wheezing:         Neurologic    Sudden weakness in arms or legs:     Sudden numbness in arms or legs:     Sudden onset of difficulty speaking or slurred speech:    Temporary loss of vision in one eye:     Problems with dizziness:         Gastrointestinal    Blood in stool:     Vomited blood:         Genitourinary    Burning when urinating:     Blood in urine:        Psychiatric    Major depression:         Hematologic    Bleeding problems:    Problems with blood clotting too easily:        Skin    Rashes or ulcers:        Constitutional    Fever or chills:      PHYSICAL EXAM: Vitals:   11/26/23 1549 11/26/23 1551  BP: 135/82 131/82  Pulse: 80   Resp: 18    Temp: 97.6 F (36.4 C)   TempSrc: Temporal   SpO2: 95%   Weight: 279 lb 9.6 oz (126.8 kg)   Height: 6' 1 (1.854 m)     GENERAL: The patient is a well-nourished male, in no acute distress. The vital signs are documented above. CARDIAC: There is a regular rate and rhythm.  PULMONARY: No respiratory distress. ABDOMEN: Soft and non-tender . MUSCULOSKELETAL: There are no major deformities or cyanosis. NEUROLOGIC: No focal weakness or paresthesias are detected.  Cranial nerves II through XII grossly intact SKIN: There are no ulcers or rashes noted. PSYCHIATRIC: The patient has a normal affect.  DATA:   Carotid duplex today again shows 40 to 59% right ICA stenosis by velocity criteria and a 1-39% stenosis on the left  Carotid ultrasound from [facility] imaging 03/15/2021 shows moderate amount of right-sided atherosclerotic plaque with right internal carotid artery velocities compatible with 50 to 69% luminal narrowing.  Assessment/Plan:  58 year old male presents for interval 1 year follow-up of his carotid artery disease.  His duplex today again shows a 40-59% right ICA stenosis.  He carotid disease remains asymptomatic.  Based on carotid duplex today he has a stable stenosis in the 40 to 59% range in the right ICA.  No indication for surgical intervention.  Discussed we reserve surgical intervention for greater than 80% stenosis for asymptomatic carotid disease.  I have recommended continued medical management with aspirin and statin.  I will have him follow-up in 1 year with carotid ultrasound here in the office for continued surveillance.   Lonni DOROTHA Gaskins, MD [facility] Office: [phone]

## 2023-12-10 DIAGNOSIS — Z23 Encounter for immunization: Secondary | ICD-10-CM | POA: Diagnosis not present

## 2023-12-10 DIAGNOSIS — I10 Essential (primary) hypertension: Secondary | ICD-10-CM | POA: Diagnosis not present

## 2023-12-10 DIAGNOSIS — E782 Mixed hyperlipidemia: Secondary | ICD-10-CM | POA: Diagnosis not present

## 2023-12-10 DIAGNOSIS — Z0000 Encounter for general adult medical examination without abnormal findings: Secondary | ICD-10-CM | POA: Diagnosis not present

## 2023-12-10 DIAGNOSIS — R7303 Prediabetes: Secondary | ICD-10-CM | POA: Diagnosis not present

## 2023-12-26 DIAGNOSIS — J300 Vasomotor rhinitis: Secondary | ICD-10-CM | POA: Diagnosis not present

## 2023-12-26 DIAGNOSIS — T781XXD Other adverse food reactions, not elsewhere classified, subsequent encounter: Secondary | ICD-10-CM | POA: Diagnosis not present

## 2023-12-26 DIAGNOSIS — J4540 Moderate persistent asthma, uncomplicated: Secondary | ICD-10-CM | POA: Diagnosis not present

## 2023-12-26 DIAGNOSIS — R21 Rash and other nonspecific skin eruption: Secondary | ICD-10-CM | POA: Diagnosis not present

## 2024-03-09 DIAGNOSIS — R051 Acute cough: Secondary | ICD-10-CM | POA: Diagnosis not present

## 2024-03-09 DIAGNOSIS — R062 Wheezing: Secondary | ICD-10-CM | POA: Diagnosis not present

## 2024-03-09 DIAGNOSIS — J40 Bronchitis, not specified as acute or chronic: Secondary | ICD-10-CM | POA: Diagnosis not present

## 2024-04-14 DIAGNOSIS — M1812 Unilateral primary osteoarthritis of first carpometacarpal joint, left hand: Secondary | ICD-10-CM | POA: Diagnosis not present

## 2024-04-14 DIAGNOSIS — M180 Bilateral primary osteoarthritis of first carpometacarpal joints: Secondary | ICD-10-CM | POA: Diagnosis not present

## 2024-04-14 DIAGNOSIS — M1811 Unilateral primary osteoarthritis of first carpometacarpal joint, right hand: Secondary | ICD-10-CM | POA: Diagnosis not present
